# Patient Record
Sex: Male | Born: 1995 | Race: White | Hispanic: No | Marital: Single
Health system: Southern US, Community
[De-identification: ages and names within clinical notes are randomized; demographics above are authoritative.]

---
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
## 2013-11-25 ENCOUNTER — Other Ambulatory Visit: Payer: Self-pay | Admitting: Orthopaedic Surgery

## 2013-11-25 DIAGNOSIS — M25552 Pain in left hip: Secondary | ICD-10-CM

## 2013-12-12 ENCOUNTER — Ambulatory Visit
Admission: RE | Admit: 2013-12-12 | Discharge: 2013-12-12 | Disposition: A | Discharge status: Home / Self Care | Payer: BC Managed Care – PPO | Source: Ambulatory Visit | Attending: Orthopaedic Surgery | Admitting: Orthopaedic Surgery

## 2013-12-12 DIAGNOSIS — M25552 Pain in left hip: Secondary | ICD-10-CM

## 2013-12-12 MED ORDER — IOHEXOL 180 MG/ML  SOLN
15.0000 mL | Freq: Once | INTRAMUSCULAR | Status: AC | PRN
  Administered 2013-12-12: 15 mL via INTRA_ARTICULAR

## 2015-08-29 IMAGING — RF DG FLUORO GUIDE NDL PLC/BX
4 series · 4 of 4 positions shown · non-contrast
Comparison: none

CLINICAL DATA: Left hip pain

[Series 1: (hospital) · 1 of 1 slices shown (1 of 4)]
[im 1/1]
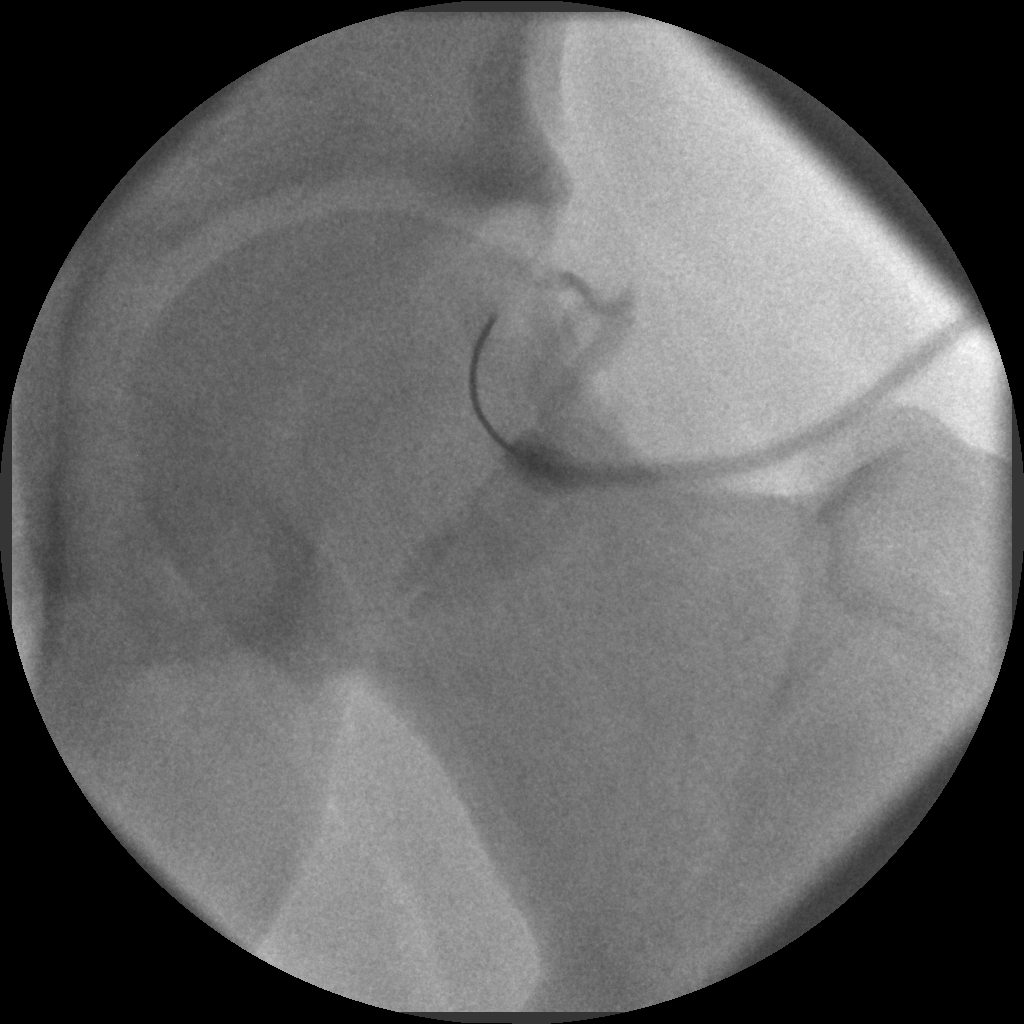

[Series 2: (hospital) · 1 of 1 slices shown (2 of 4)]
[im 1/1]
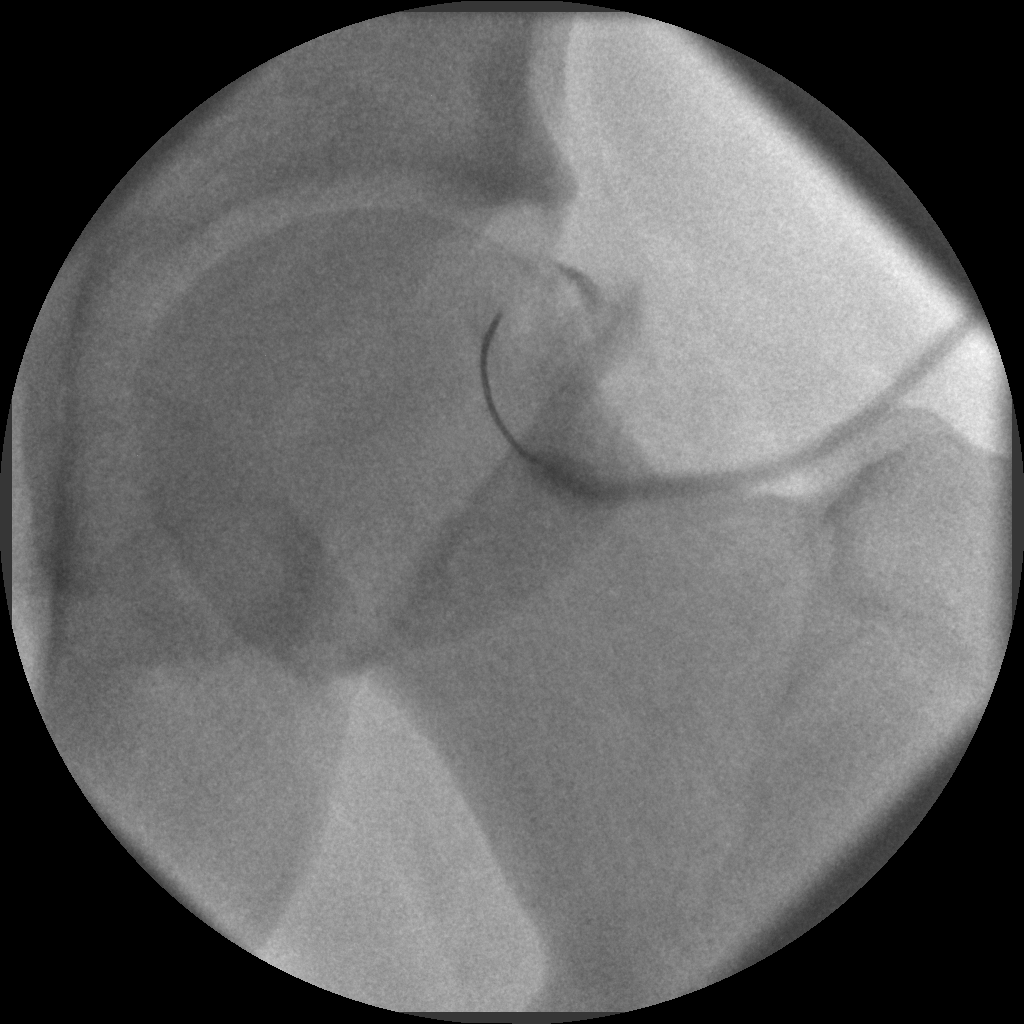

[Series 3: (hospital) · 1 of 1 slices shown (3 of 4)]
[im 1/1]
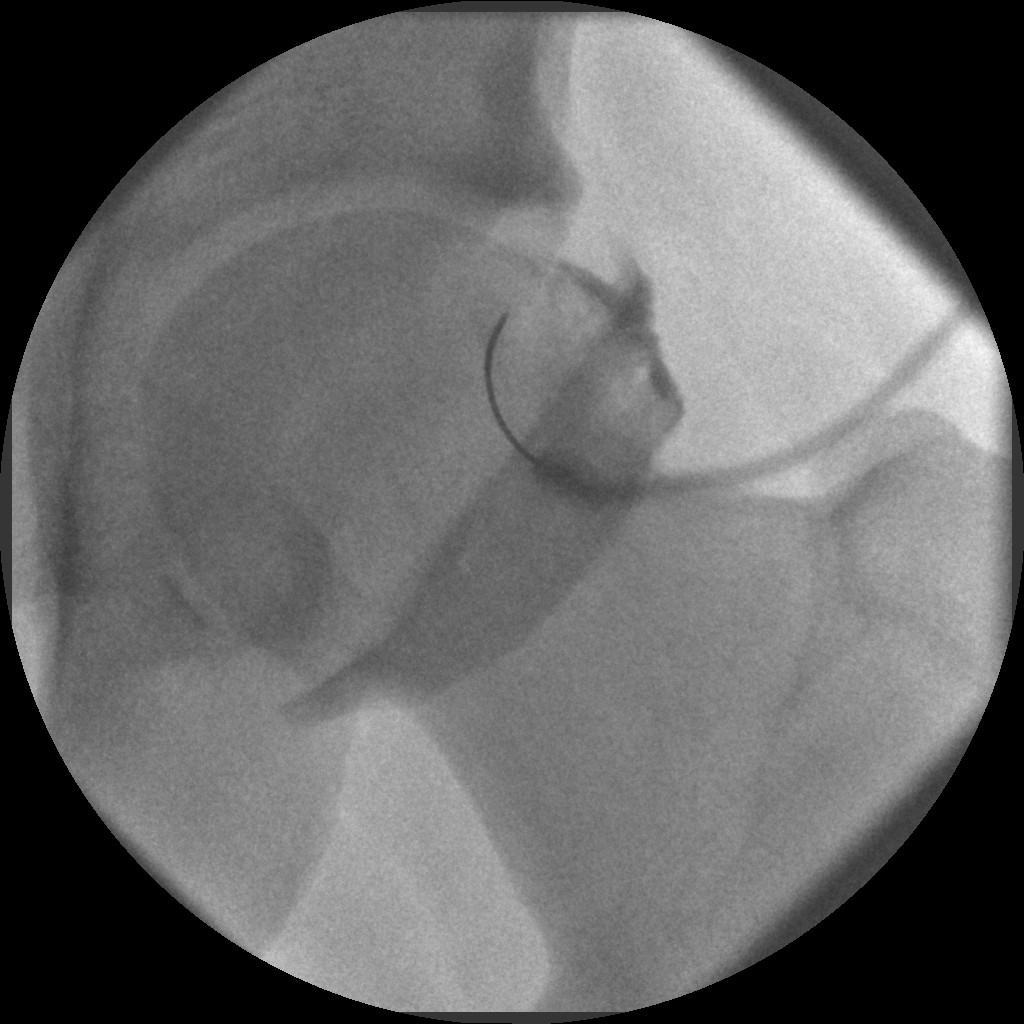

[Series 4: (hospital) · 1 of 1 slices shown (4 of 4)]
[im 1/1]
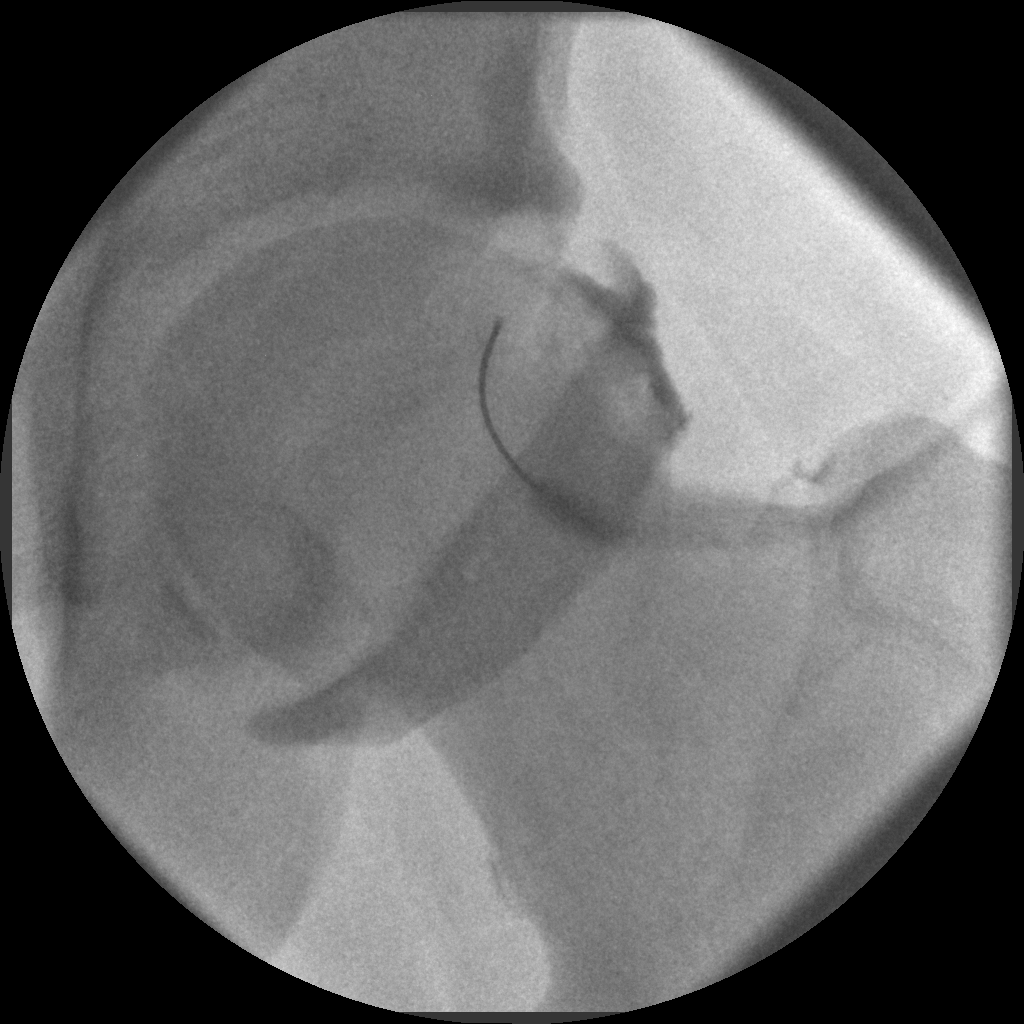

[4 of 4 positions shown; findings below may reference images not displayed]

EXAM:
Left HIP INJECTION FOR MRI

FLUOROSCOPY TIME:  Dictate in minutes and seconds

PROCEDURE:
Overlying skin prepped with Betadine, draped in the usual sterile
fashion, and infiltrated locally with Lidocaine. Curved 22 gauge
spinal needle advanced to the superolateral margin of the Left
femoral head. 1 ml of Lidocaine injected easily. A mixture of 0.1 ml
Multihance in 10 ml of dilute Omnipaque was then used to opacify the
left femoral head. No immediate complication.
IMPRESSION: Technically successful Left hip injection under fluoroscopy for MR
arthrogram.

## 2020-01-25 ENCOUNTER — Encounter (HOSPITAL_BASED_OUTPATIENT_CLINIC_OR_DEPARTMENT_OTHER): Payer: Self-pay

## 2020-01-25 ENCOUNTER — Other Ambulatory Visit: Payer: Self-pay

## 2020-01-25 ENCOUNTER — Emergency Department (HOSPITAL_BASED_OUTPATIENT_CLINIC_OR_DEPARTMENT_OTHER): Payer: 59

## 2020-01-25 ENCOUNTER — Emergency Department (HOSPITAL_BASED_OUTPATIENT_CLINIC_OR_DEPARTMENT_OTHER)
Admission: EM | Admit: 2020-01-25 | Discharge: 2020-01-25 | Disposition: A | Discharge status: Home / Self Care | Payer: 59 | Attending: Emergency Medicine | Admitting: Emergency Medicine

## 2020-01-25 DIAGNOSIS — Z79899 Other long term (current) drug therapy: Secondary | ICD-10-CM | POA: Diagnosis not present

## 2020-01-25 DIAGNOSIS — Y999 Unspecified external cause status: Secondary | ICD-10-CM | POA: Diagnosis not present

## 2020-01-25 DIAGNOSIS — S86912A Strain of unspecified muscle(s) and tendon(s) at lower leg level, left leg, initial encounter: Secondary | ICD-10-CM

## 2020-01-25 DIAGNOSIS — W208XXA Other cause of strike by thrown, projected or falling object, initial encounter: Secondary | ICD-10-CM | POA: Diagnosis not present

## 2020-01-25 DIAGNOSIS — Y939 Activity, unspecified: Secondary | ICD-10-CM | POA: Insufficient documentation

## 2020-01-25 DIAGNOSIS — Y929 Unspecified place or not applicable: Secondary | ICD-10-CM | POA: Insufficient documentation

## 2020-01-25 DIAGNOSIS — S80912A Unspecified superficial injury of left knee, initial encounter: Secondary | ICD-10-CM | POA: Diagnosis present

## 2020-01-25 MED ORDER — OXYCODONE-ACETAMINOPHEN 5-325 MG PO TABS: 1.0000 | ORAL_TABLET | ORAL | 0 refills | Status: AC | PRN

## 2020-01-25 MED ORDER — OXYCODONE-ACETAMINOPHEN 5-325 MG PO TABS
1.0000 | ORAL_TABLET | Freq: Once | ORAL | Status: AC
  Administered 2020-01-25: 1 via ORAL
  Filled 2020-01-25: qty 1

## 2020-01-25 NOTE — Discharge Instructions (Signed)
Please schedule follow-up appointment with your primary orthopedist.  If you do not have one, you can follow-up with Dr. Jordan Likes.  Recommend rest, ice, elevation.  You can bear weight as tolerated.  If your pain worsens, you are unable to walk, please return to ER for reassessment.  Recommend Tylenol Motrin for pain control.  For breakthrough pain, you can take the prescribed Percocet as needed.  Note this can make you drowsy extremity, driving or operating heavy machinery.

## 2020-01-25 NOTE — ED Provider Notes (Signed)
[facility] EMERGENCY DEPARTMENT Provider Note   CSN: [id number] Arrival date & time: 01/25/20  4098     History Chief Complaint  Patient presents with   Knee Pain    Shawn Wang is a 24 y.o. male.  Presents to ER with concern for knee pain.  Patient reports that symptoms started last night after he fell on his left side, reports he was wrestling with a friend.  Today able to bear weight but having fair amount of pain, sharp, stabbing, worse with movement, improved with rest.  Took some ibuprofen with minimal relief.  Denies any prior injuries to this knee, broke his proximal tibia right side a few years ago.  HPI     History reviewed. No pertinent past medical history.  There are no problems to display for this patient.  History reviewed. No pertinent family history.  Social History   Tobacco Use   Smoking status: Not on file  Substance Use Topics   Alcohol use: Not on file   Drug use: Not on file    Home Medications Prior to Admission medications   Medication Sig Start Date End Date Taking? Authorizing Provider  oxyCODONE-acetaminophen (PERCOCET) 5-325 MG tablet Take 1 tablet by mouth every 4 (four) hours as needed for up to 3 days for severe pain. 01/25/20 01/28/20  Milagros Loll, MD    Allergies    Patient has no known allergies.  Review of Systems   Review of Systems  All other systems reviewed and are negative.   Physical Exam Updated Vital Signs BP 124/77 (BP Location: Right Arm)    Pulse (!) 48    Temp 98 F (36.7 C) (Oral)    Resp 16    Ht 6\' 3"  (1.905 m)    Wt 104.3 kg    SpO2 99%    BMI 28.75 kg/m   Physical Exam Vitals and nursing note reviewed.  Constitutional:      Appearance: Normal appearance.  HENT:     Head: Normocephalic and atraumatic.     Nose: Nose normal.     Mouth/Throat:     Mouth: Mucous membranes are moist.  Eyes:     Extraocular Movements: Extraocular movements intact.     Pupils: Pupils are equal, round,  and reactive to light.  Cardiovascular:     Rate and Rhythm: Normal rate.     Pulses: Normal pulses.  Pulmonary:     Effort: Pulmonary effort is normal. No respiratory distress.  Abdominal:     General: Abdomen is flat.     Palpations: Abdomen is soft.     Tenderness: There is no abdominal tenderness.  Musculoskeletal:     Cervical back: Normal range of motion. No rigidity.     Comments: LLE: Mild swelling of knee, there is TTP over proximal knee, no joint laxity, normal DP/PT pulses, normal distal motor  Neurological:     Mental Status: He is alert.     ED Results / Procedures / Treatments   Labs (all labs ordered are listed, but only abnormal results are displayed) Labs Reviewed - No data to display  EKG None  Radiology DG Knee Complete 4 Views Left  Result Date: 01/25/2020 CLINICAL DATA:  Left knee pain, anterior and lateral swelling, unable to bear weight or flex knee, no previous surgeries or injections EXAM: LEFT KNEE - COMPLETE 4+ VIEW COMPARISON:  None. FINDINGS: No evidence of fracture or dislocation. Small joint effusion. No evidence of arthropathy or other focal  bone abnormality. There is anterior soft tissue swelling. IMPRESSION: 1. No acute osseous abnormality. 2. Small joint effusion and anterior soft tissue swelling. Electronically Signed   By: Emmaline Kluver M.D.   On: 01/25/2020 10:03    Procedures Procedures (including critical care time)  Medications Ordered in ED Medications  oxyCODONE-acetaminophen (PERCOCET/ROXICET) 5-325 MG per tablet 1 tablet (1 tablet Oral Given 01/25/20 2952)    ED Course  I have reviewed the triage vital signs and the nursing notes.  Pertinent labs & imaging results that were available during my care of the patient were reviewed by me and considered in my medical decision making (see chart for details).    MDM Rules/Calculators/A&P                         24 year old male presents to ER with concern for knee pain after  fall.  On exam noted some swelling to the knee, had some tenderness over his distal femur.  No tenderness over his proximal tibia.  Plain film negative.  He is able to ambulate albeit with some pain.  Lower suspicion for occult fracture.  Recommended follow-up with his orthopedist that he has seen for injury on his right leg previously.  Recommended weightbearing as tolerated for now.  Discharged home.    After the discussed management above, the patient was determined to be safe for discharge.  The patient was in agreement with this plan and all questions regarding their care were answered.  ED return precautions were discussed and the patient will return to the ED with any significant worsening of condition.    Final Clinical Impression(s) / ED Diagnoses Final diagnoses:  Knee strain, left, initial encounter    Rx / DC Orders ED Discharge Orders         Ordered    oxyCODONE-acetaminophen (PERCOCET) 5-325 MG tablet  Every 4 hours PRN        01/25/20 1024           Milagros Loll, MD 01/26/20 [id number]

## 2020-01-25 NOTE — ED Triage Notes (Signed)
Pt states fell last night, landing on left side, this morning increased swelling and pain, can bear weight but painful to do so, also pain with flexion/extension.  Ice applied, took ibuprofen this morning.

## 2020-01-25 NOTE — ED Notes (Signed)
Ice applied to left knee

## 2020-01-25 NOTE — ED Notes (Signed)
Patient transported to X-ray 

## 2020-01-28 ENCOUNTER — Ambulatory Visit: Payer: 59 | Admitting: Family Medicine

## 2020-02-04 ENCOUNTER — Ambulatory Visit: Payer: 59 | Admitting: Family Medicine

## 2020-02-04 NOTE — Progress Notes (Deleted)
  Shawn Wang - 24 y.o. male MRN [id number]  Date of birth: 1995-06-07  SUBJECTIVE:  Including CC & ROS.  No chief complaint on file.   Shawn Wang is a 24 y.o. male that is  ***.  ***   Review of Systems See HPI   HISTORY: Past Medical, Surgical, Social, and Family History Reviewed & Updated per EMR.   Pertinent Historical Findings include:  No past medical history on file.  *** The histories are not reviewed yet. Please review them in the "History" navigator section and refresh this SmartLink.  No family history on file.  Social History   Socioeconomic History  . Marital status: Single    Spouse name: Not on file  . Number of children: Not on file  . Years of education: Not on file  . Highest education level: Not on file  Occupational History  . Not on file  Tobacco Use  . Smoking status: Not on file  Substance and Sexual Activity  . Alcohol use: Not on file  . Drug use: Not on file  . Sexual activity: Not on file  Other Topics Concern  . Not on file  Social History Narrative  . Not on file   Social Determinants of Health   Financial Resource Strain:   . Difficulty of Paying Living Expenses: Not on file  Food Insecurity:   . Worried About Programme researcher, broadcasting/film/video in the Last Year: Not on file  . Ran Out of Food in the Last Year: Not on file  Transportation Needs:   . Lack of Transportation (Medical): Not on file  . Lack of Transportation (Non-Medical): Not on file  Physical Activity:   . Days of Exercise per Week: Not on file  . Minutes of Exercise per Session: Not on file  Stress:   . Feeling of Stress : Not on file  Social Connections:   . Frequency of Communication with Friends and Family: Not on file  . Frequency of Social Gatherings with Friends and Family: Not on file  . Attends Religious Services: Not on file  . Active Member of Clubs or Organizations: Not on file  . Attends Banker Meetings: Not on file  . Marital Status: Not on file    Intimate Partner Violence:   . Fear of Current or Ex-Partner: Not on file  . Emotionally Abused: Not on file  . Physically Abused: Not on file  . Sexually Abused: Not on file     PHYSICAL EXAM:  VS: There were no vitals taken for this visit. Physical Exam Gen: NAD, alert, cooperative with exam, well-appearing MSK:  ***      ASSESSMENT & PLAN:   No problem-specific Assessment & Plan notes found for this encounter.

## 2022-09-13 ENCOUNTER — Encounter: Payer: Self-pay | Admitting: *Deleted
